# Patient Record
Sex: Male | Born: 1955 | Race: Black or African American | Hispanic: No | Smoking: Current every day smoker
Health system: Southern US, Community
[De-identification: ages and names within clinical notes are randomized; demographics above are authoritative.]

## PROBLEM LIST (undated history)

## (undated) HISTORY — PX: HAND SURGERY: SHX662

---

## 2004-05-29 ENCOUNTER — Emergency Department (HOSPITAL_COMMUNITY): Admission: EM | Admit: 2004-05-29 | Discharge: 2004-05-29 | Payer: Self-pay | Admitting: Emergency Medicine

## 2006-05-23 ENCOUNTER — Emergency Department (HOSPITAL_COMMUNITY): Admission: EM | Admit: 2006-05-23 | Discharge: 2006-05-23 | Payer: Self-pay | Admitting: Emergency Medicine

## 2006-08-21 ENCOUNTER — Emergency Department (HOSPITAL_COMMUNITY): Admission: EM | Admit: 2006-08-21 | Discharge: 2006-08-21 | Payer: Self-pay | Admitting: Emergency Medicine

## 2007-11-16 IMAGING — CR DG FOREARM 2V*R*
2 series · 2 of 2 positions shown · non-contrast
Comparison: none

CLINICAL DATA: 51 year-old, right forearm pain.
 RIGHT FOREARM ? 2 VIEW:

[x forearm ap right]
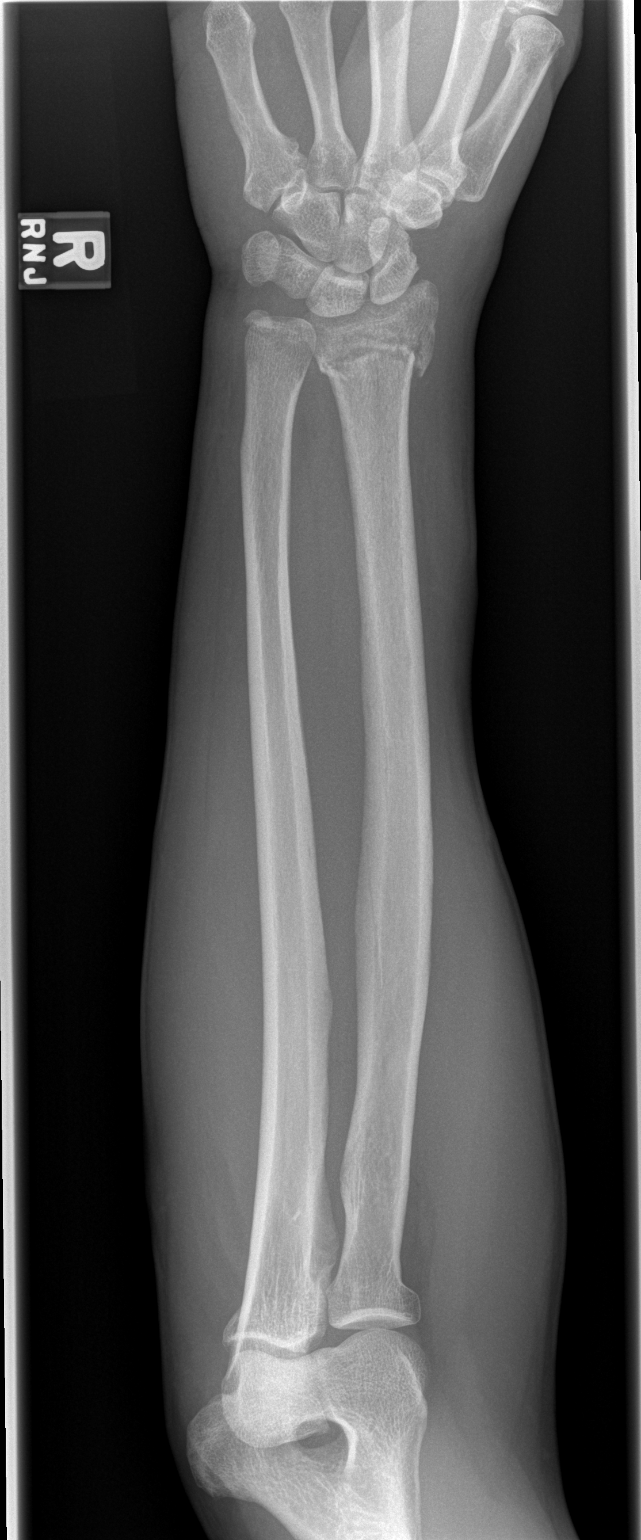

[x forearm lat right]
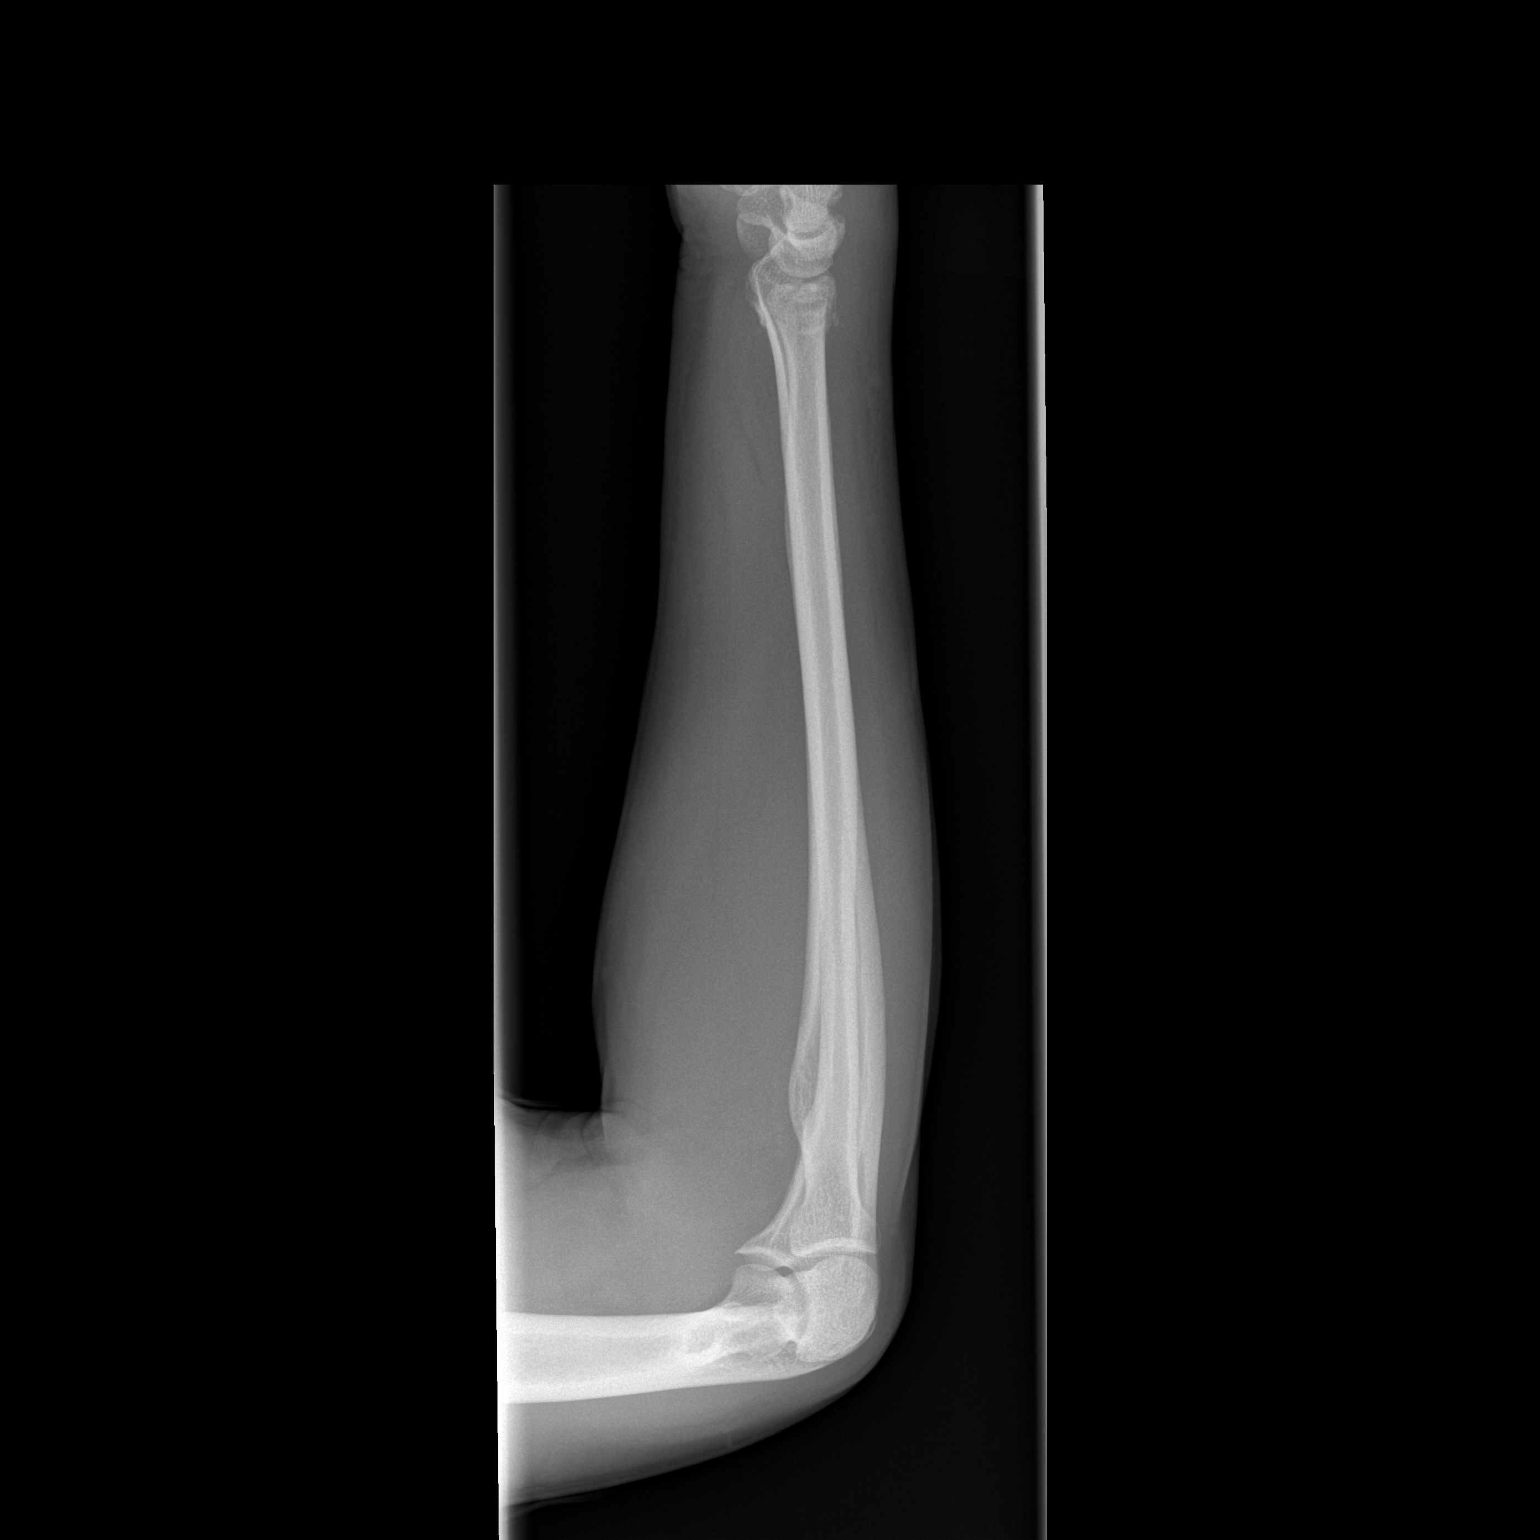

[2 of 2 positions shown; findings below may reference images not displayed]

FINDINGS: There is a nondisplaced transverse fracture through the distal radius, and an associated ulnar styloid fracture.  Wrists and elbow joints are maintained.
IMPRESSION: Distal radius and ulnar fractures.

## 2013-01-10 ENCOUNTER — Emergency Department (HOSPITAL_COMMUNITY)
Admission: EM | Admit: 2013-01-10 | Discharge: 2013-01-10 | Disposition: A | Discharge status: Home / Self Care | Payer: No Typology Code available for payment source | Source: Home / Self Care | Attending: Emergency Medicine | Admitting: Emergency Medicine

## 2013-01-10 ENCOUNTER — Encounter (HOSPITAL_COMMUNITY): Payer: Self-pay | Admitting: Emergency Medicine

## 2013-01-10 DIAGNOSIS — K047 Periapical abscess without sinus: Secondary | ICD-10-CM

## 2013-01-10 MED ORDER — PENICILLIN V POTASSIUM 500 MG PO TABS: 500.0000 mg | ORAL_TABLET | Freq: Four times a day (QID) | ORAL | Status: AC

## 2013-01-10 MED ORDER — HYDROCODONE-ACETAMINOPHEN 5-325 MG PO TABS: 1.0000 | ORAL_TABLET | Freq: Four times a day (QID) | ORAL | Status: DC | PRN

## 2013-01-10 NOTE — ED Notes (Signed)
C/o abscess on left side of mouth for three days now States he doesn't have any dentist appt yet.  Tylenol was taken as treatment

## 2013-01-10 NOTE — ED Provider Notes (Signed)
Medical screening examination/treatment/procedure(s) were performed by non-physician practitioner and as supervising physician I was immediately available for consultation/collaboration.  Leslee Home, M.D.  Reuben Likes, MD 01/10/13 [phone]

## 2013-01-10 NOTE — ED Provider Notes (Signed)
CSN: [id number]     Arrival date & time 01/10/13  1136 History   First MD Initiated Contact with Patient 01/10/13 1303     Chief Complaint  Patient presents with  . Dental Pain   (Consider location/radiation/quality/duration/timing/severity/associated sxs/prior Treatment) HPI Comments: Pt with several "bad teeth", pain for last 2 weeks, swelling and worse pain for last 3 days  Patient is a 57 y.o. male presenting with tooth pain. The history is provided by the patient.  Dental Pain Location:  Upper Upper teeth location:  10/LU lateral incisor Quality:  Aching and constant Severity:  Severe Onset quality:  Gradual Duration:  3 days Timing:  Constant Progression:  Worsening Chronicity:  Recurrent Context: dental caries and dental fracture   Relieved by:  Nothing Exacerbated by: smoking. Ineffective treatments:  Acetaminophen Associated symptoms: facial swelling and gum swelling   Associated symptoms: no congestion and no fever   Risk factors: periodontal disease and smoking     History reviewed. No pertinent past medical history. History reviewed. No pertinent past surgical history. History reviewed. No pertinent family history. History  Substance Use Topics  . Smoking status: Not on file  . Smokeless tobacco: Not on file  . Alcohol Use: Not on file    Review of Systems  Constitutional: Negative for fever and chills.  HENT: Positive for dental problem and facial swelling. Negative for congestion.     Allergies  Review of patient's allergies indicates no known allergies.  Home Medications   Current Outpatient Rx  Name  Route  Sig  Dispense  Refill  . HYDROcodone-acetaminophen (NORCO/VICODIN) 5-325 MG per tablet   Oral   Take 1-2 tablets by mouth every 6 (six) hours as needed.   10 tablet   0   . penicillin v potassium (VEETID) 500 MG tablet   Oral   Take 1 tablet (500 mg total) by mouth 4 (four) times daily.   40 tablet   0    BP 151/87  Pulse 69   Temp(Src) 98.1 F (36.7 C) (Oral)  Resp 18  SpO2 99% Physical Exam  Constitutional: He appears well-developed and well-nourished.  Appears uncomfortable  HENT:  Mouth/Throat: Dental abscesses and dental caries present.  A few teeth are absent. Remaining teeth are all in very poor repair.  All of them with chips/fractures, all discolored with evidence of significant decay.  Tooth causing pt most problems at this time is L upper lateral incisor. There is some swelling of the gum around this tooth. No swelling under tongue. No facial swelling  Lymphadenopathy:       Head (right side): No submental, no submandibular and no tonsillar adenopathy present.       Head (left side): No submental, no submandibular and no tonsillar adenopathy present.    ED Course  Procedures (including critical care time) Labs Review Labs Reviewed - No data to display Imaging Review No results found.  EKG Interpretation    Date/Time:    Ventricular Rate:    PR Interval:    QRS Duration:   QT Interval:    QTC Calculation:   R Axis:     Text Interpretation:              MDM   1. Dental abscess   rx penicillin 500mg  QID #40 and hydrocodone/apap 5/325 1-2 po q 6hours prn pain #10. Pt given dental resources.      Cathlyn Parsons, NP 01/10/13 1313

## 2013-01-13 ENCOUNTER — Ambulatory Visit: Payer: No Typology Code available for payment source | Attending: Internal Medicine

## 2013-01-13 VITALS — BP 133/84 | HR 75 | Temp 98.1°F | Resp 16 | Ht 66.0 in | Wt 136.0 lb

## 2013-01-13 DIAGNOSIS — K047 Periapical abscess without sinus: Secondary | ICD-10-CM

## 2013-01-13 DIAGNOSIS — Z23 Encounter for immunization: Secondary | ICD-10-CM

## 2013-01-13 MED ORDER — HYDROCODONE-ACETAMINOPHEN 5-325 MG PO TABS: 1.0000 | ORAL_TABLET | Freq: Four times a day (QID) | ORAL | Status: AC | PRN

## 2013-01-13 NOTE — Patient Instructions (Signed)
°  Dental Abscess °A dental abscess is a collection of infected fluid (pus) from a bacterial infection in the inner part of the tooth (pulp). It usually occurs at the end of the tooth's root.  °CAUSES  °· Severe tooth decay. °· Trauma to the tooth that allows bacteria to enter into the pulp, such as a broken or chipped tooth. °SYMPTOMS  °· Severe pain in and around the infected tooth. °· Swelling and redness around the abscessed tooth or in the mouth or face. °· Tenderness. °· Pus drainage. °· Bad breath. °· Bitter taste in the mouth. °· Difficulty swallowing. °· Difficulty opening the mouth. °· Nausea. °· Vomiting. °· Chills. °· Swollen neck glands. °DIAGNOSIS  °· A medical and dental history will be taken. °· An examination will be performed by tapping on the abscessed tooth. °· X-rays may be taken of the tooth to identify the abscess. °TREATMENT °The goal of treatment is to eliminate the infection. You may be prescribed antibiotic medicine to stop the infection from spreading. A root canal may be performed to save the tooth. If the tooth cannot be saved, it may be pulled (extracted) and the abscess may be drained.  °HOME CARE INSTRUCTIONS °· Only take over-the-counter or prescription medicines for pain, fever, or discomfort as directed by your caregiver. °· Rinse your mouth (gargle) often with salt water (¼ tsp salt in 8 oz [250 ml] of warm water) to relieve pain or swelling. °· Do not drive after taking pain medicine (narcotics). °· Do not apply heat to the outside of your face. °· Return to your dentist for further treatment as directed. °SEEK MEDICAL CARE IF: °· Your pain is not helped by medicine. °· Your pain is getting worse instead of better. °SEEK IMMEDIATE MEDICAL CARE IF: °· You have a fever or persistent symptoms for more than 2 3 days. °· You have a fever and your symptoms suddenly get worse. °· You have chills or a very bad headache. °· You have problems breathing or swallowing. °· You have trouble  opening your mouth. °· You have swelling in the neck or around the eye. °Document Released: 01/20/2005 Document Revised: 10/15/2011 Document Reviewed: 04/30/2010 °ExitCare® Patient Information ©2014 [employer]. ° ° °

## 2013-01-13 NOTE — Progress Notes (Unsigned)
Patient ID: MARVELLE CAUDILL, male   DOB: 04/29/1955, 57 y.o.   MRN: [id number] S - Mr. Roderick is seen today for dental pain. He was seen in urgent care 3 days ago. At that time he was started on amoxicillin and given 10 tablets of narcotic pain medicine. The pain has continued and although he is in [facility] he has been unable to get an appointment with a dentist. He complains of pain in the upper left teeth as well as lower right teeth. He denies any fever or weight loss or other constitutional symptoms. He has been able to eat and complains of the pain being worse at night.  O - v/s reviewed and stable. Nursing notes reviewed.  heent - very poor dentition, mucous membranes moist and pink tonsils are without exudates nares are patent Cardiovascular- regular rate and rhythm Lungs are clear  A/P Dental abscess - Plan: HYDROcodone-acetaminophen (NORCO/VICODIN) 5-325 MG per tablet, Ambulatory referral to Dentistry rtc 6-8 weeks for welness visit

## 2013-01-13 NOTE — Progress Notes (Unsigned)
Pt here to establish care for dental left side abscess x 2 mnths Pt was seen in urgent care 01/10/13 and prescribed Penicillin V Potassium and Hydrocodone  States swelling has resolved. Afebrile. Need urgent dental referral

## 2013-01-19 ENCOUNTER — Telehealth: Payer: Self-pay | Admitting: Internal Medicine

## 2013-01-19 ENCOUNTER — Telehealth: Payer: Self-pay | Admitting: Emergency Medicine

## 2013-01-19 ENCOUNTER — Other Ambulatory Visit: Payer: Self-pay | Admitting: Emergency Medicine

## 2013-01-19 MED ORDER — TRAMADOL HCL 50 MG PO TABS: 50.0000 mg | ORAL_TABLET | Freq: Three times a day (TID) | ORAL | Status: AC | PRN

## 2013-01-19 NOTE — Telephone Encounter (Signed)
Pt came in today to check on urgent dental referral s/p abscess infection Arna Medici sent out request for urgency Pt requesting pain meds Script tramadol #60 no refill given per Dr. Izola Price Pt given dentist number

## 2013-01-19 NOTE — Telephone Encounter (Signed)
Pt came in today to check on the status of his referral and to also see about getting a script refill for HYDROcodone-acetaminophen (NORCO/VICODIN) 5-325 MG per tablet; pt was informed that a nurse would come out to speak with him about his medication

## 2013-01-19 NOTE — Telephone Encounter (Signed)
Script for Tramadol 50 mg written until pt seen by dentist

## 2013-02-01 ENCOUNTER — Ambulatory Visit: Payer: Self-pay | Admitting: Internal Medicine

## 2013-11-14 ENCOUNTER — Ambulatory Visit: Payer: Self-pay | Attending: Internal Medicine

## 2014-03-10 ENCOUNTER — Ambulatory Visit: Payer: Self-pay | Attending: Internal Medicine | Admitting: Internal Medicine

## 2014-03-10 ENCOUNTER — Encounter: Payer: Self-pay | Admitting: Internal Medicine

## 2014-03-10 VITALS — BP 140/80 | HR 73 | Temp 98.0°F | Resp 16 | Wt 145.0 lb

## 2014-03-10 DIAGNOSIS — Z720 Tobacco use: Secondary | ICD-10-CM

## 2014-03-10 DIAGNOSIS — R030 Elevated blood-pressure reading, without diagnosis of hypertension: Secondary | ICD-10-CM

## 2014-03-10 DIAGNOSIS — F17210 Nicotine dependence, cigarettes, uncomplicated: Secondary | ICD-10-CM | POA: Insufficient documentation

## 2014-03-10 DIAGNOSIS — F17200 Nicotine dependence, unspecified, uncomplicated: Secondary | ICD-10-CM

## 2014-03-10 DIAGNOSIS — Z139 Encounter for screening, unspecified: Secondary | ICD-10-CM

## 2014-03-10 DIAGNOSIS — IMO0001 Reserved for inherently not codable concepts without codable children: Secondary | ICD-10-CM

## 2014-03-10 DIAGNOSIS — M25511 Pain in right shoulder: Secondary | ICD-10-CM | POA: Insufficient documentation

## 2014-03-10 DIAGNOSIS — Z23 Encounter for immunization: Secondary | ICD-10-CM | POA: Insufficient documentation

## 2014-03-10 MED ORDER — IBUPROFEN 800 MG PO TABS: 800.0000 mg | ORAL_TABLET | Freq: Three times a day (TID) | ORAL | Status: AC | PRN

## 2014-03-10 MED ORDER — BUPROPION HCL ER (XL) 150 MG PO TB24: 150.0000 mg | ORAL_TABLET | Freq: Every day | ORAL | Status: AC

## 2014-03-10 NOTE — Patient Instructions (Signed)
DASH Eating Plan DASH stands for "Dietary Approaches to Stop Hypertension." The DASH eating plan is a healthy eating plan that has been shown to reduce high blood pressure (hypertension). Additional health benefits may include reducing the risk of type 2 diabetes mellitus, heart disease, and stroke. The DASH eating plan may also help with weight loss. WHAT DO I NEED TO KNOW ABOUT THE DASH EATING PLAN? For the DASH eating plan, you will follow these general guidelines:  Choose foods with a percent daily value for sodium of less than 5% (as listed on the food label).  Use salt-free seasonings or herbs instead of table salt or sea salt.  Check with your health care provider or pharmacist before using salt substitutes.  Eat lower-sodium products, often labeled as "lower sodium" or "no salt added."  Eat fresh foods.  Eat more vegetables, fruits, and low-fat dairy products.  Choose whole grains. Look for the word "whole" as the first word in the ingredient list.  Choose fish and skinless chicken or turkey more often than red meat. Limit fish, poultry, and meat to 6 oz (170 g) each day.  Limit sweets, desserts, sugars, and sugary drinks.  Choose heart-healthy fats.  Limit cheese to 1 oz (28 g) per day.  Eat more home-cooked food and less restaurant, buffet, and fast food.  Limit fried foods.  Cook foods using methods other than frying.  Limit canned vegetables. If you do use them, rinse them well to decrease the sodium.  When eating at a restaurant, ask that your food be prepared with less salt, or no salt if possible. WHAT FOODS CAN I EAT? Seek help from a dietitian for individual calorie needs. Grains Whole grain or whole wheat bread. Brown rice. Whole grain or whole wheat pasta. Quinoa, bulgur, and whole grain cereals. Low-sodium cereals. Corn or whole wheat flour tortillas. Whole grain cornbread. Whole grain crackers. Low-sodium crackers. Vegetables Fresh or frozen vegetables  (raw, steamed, roasted, or grilled). Low-sodium or reduced-sodium tomato and vegetable juices. Low-sodium or reduced-sodium tomato sauce and paste. Low-sodium or reduced-sodium canned vegetables.  Fruits All fresh, canned (in natural juice), or frozen fruits. Meat and Other Protein Products Ground beef (85% or leaner), grass-fed beef, or beef trimmed of fat. Skinless chicken or turkey. Ground chicken or turkey. Pork trimmed of fat. All fish and seafood. Eggs. Dried beans, peas, or lentils. Unsalted nuts and seeds. Unsalted canned beans. Dairy Low-fat dairy products, such as skim or 1% milk, 2% or reduced-fat cheeses, low-fat ricotta or cottage cheese, or plain low-fat yogurt. Low-sodium or reduced-sodium cheeses. Fats and Oils Tub margarines without trans fats. Light or reduced-fat mayonnaise and salad dressings (reduced sodium). Avocado. Safflower, olive, or canola oils. Natural peanut or almond butter. Other Unsalted popcorn and pretzels. The items listed above may not be a complete list of recommended foods or beverages. Contact your dietitian for more options. WHAT FOODS ARE NOT RECOMMENDED? Grains White bread. White pasta. White rice. Refined cornbread. Bagels and croissants. Crackers that contain trans fat. Vegetables Creamed or fried vegetables. Vegetables in a cheese sauce. Regular canned vegetables. Regular canned tomato sauce and paste. Regular tomato and vegetable juices. Fruits Dried fruits. Canned fruit in light or heavy syrup. Fruit juice. Meat and Other Protein Products Fatty cuts of meat. Ribs, chicken wings, bacon, sausage, bologna, salami, chitterlings, fatback, hot dogs, bratwurst, and packaged luncheon meats. Salted nuts and seeds. Canned beans with salt. Dairy Whole or 2% milk, cream, half-and-half, and cream cheese. Whole-fat or sweetened yogurt. Full-fat   cheeses or blue cheese. Nondairy creamers and whipped toppings. Processed cheese, cheese spreads, or cheese  curds. Condiments Onion and garlic salt, seasoned salt, table salt, and sea salt. Canned and packaged gravies. Worcestershire sauce. Tartar sauce. Barbecue sauce. Teriyaki sauce. Soy sauce, including reduced sodium. Steak sauce. Fish sauce. Oyster sauce. Cocktail sauce. Horseradish. Ketchup and mustard. Meat flavorings and tenderizers. Bouillon cubes. Hot sauce. Tabasco sauce. Marinades. Taco seasonings. Relishes. Fats and Oils Butter, stick margarine, lard, shortening, ghee, and bacon fat. Coconut, palm kernel, or palm oils. Regular salad dressings. Other Pickles and olives. Salted popcorn and pretzels. The items listed above may not be a complete list of foods and beverages to avoid. Contact your dietitian for more information. WHERE CAN I FIND MORE INFORMATION? National Heart, Lung, and Blood Institute: www.nhlbi.nih.gov/health/health-topics/topics/dash/ Document Released: 01/09/2011 Document Revised: 06/06/2013 Document Reviewed: 11/24/2012 ExitCare Patient Information 2015 [employer]. This information is not intended to replace advice given to you by your health care provider. Make sure you discuss any questions you have with your health care provider. Smoking Cessation Quitting smoking is important to your health and has many advantages. However, it is not always easy to quit since nicotine is a very addictive drug. Oftentimes, people try 3 times or more before being able to quit. This document explains the best ways for you to prepare to quit smoking. Quitting takes hard work and a lot of effort, but you can do it. ADVANTAGES OF QUITTING SMOKING  You will live longer, feel better, and live better.  Your body will feel the impact of quitting smoking almost immediately.  Within 20 minutes, blood pressure decreases. Your pulse returns to its normal level.  After 8 hours, carbon monoxide levels in the blood return to normal. Your oxygen level increases.  After 24 hours, the chance of  having a heart attack starts to decrease. Your breath, hair, and body stop smelling like smoke.  After 48 hours, damaged nerve endings begin to recover. Your sense of taste and smell improve.  After 72 hours, the body is virtually free of nicotine. Your bronchial tubes relax and breathing becomes easier.  After 2 to 12 weeks, lungs can hold more air. Exercise becomes easier and circulation improves.  The risk of having a heart attack, stroke, cancer, or lung disease is greatly reduced.  After 1 year, the risk of coronary heart disease is cut in half.  After 5 years, the risk of stroke falls to the same as a nonsmoker.  After 10 years, the risk of lung cancer is cut in half and the risk of other cancers decreases significantly.  After 15 years, the risk of coronary heart disease drops, usually to the level of a nonsmoker.  If you are pregnant, quitting smoking will improve your chances of having a healthy baby.  The people you live with, especially any children, will be healthier.  You will have extra money to spend on things other than cigarettes. QUESTIONS TO THINK ABOUT BEFORE ATTEMPTING TO QUIT You may want to talk about your answers with your health care provider.  Why do you want to quit?  If you tried to quit in the past, what helped and what did not?  What will be the most difficult situations for you after you quit? How will you plan to handle them?  Who can help you through the tough times? Your family? Friends? A health care provider?  What pleasures do you get from smoking? What ways can you still get pleasure   if you quit? Here are some questions to ask your health care provider:  How can you help me to be successful at quitting?  What medicine do you think would be best for me and how should I take it?  What should I do if I need more help?  What is smoking withdrawal like? How can I get information on withdrawal? GET READY  Set a quit date.  Change your  environment by getting rid of all cigarettes, ashtrays, matches, and lighters in your home, car, or work. Do not let people smoke in your home.  Review your past attempts to quit. Think about what worked and what did not. GET SUPPORT AND ENCOURAGEMENT You have a better chance of being successful if you have help. You can get support in many ways.  Tell your family, friends, and coworkers that you are going to quit and need their support. Ask them not to smoke around you.  Get individual, group, or telephone counseling and support. Programs are available at local hospitals and health centers. Call your local health department for information about programs in your area.  Spiritual beliefs and practices may help some smokers quit.  Download a "quit meter" on your computer to keep track of quit statistics, such as how long you have gone without smoking, cigarettes not smoked, and money saved.  Get a self-help book about quitting smoking and staying off tobacco. LEARN NEW SKILLS AND BEHAVIORS  Distract yourself from urges to smoke. Talk to someone, go for a walk, or occupy your time with a task.  Change your normal routine. Take a different route to work. Drink tea instead of coffee. Eat breakfast in a different place.  Reduce your stress. Take a hot bath, exercise, or read a book.  Plan something enjoyable to do every day. Reward yourself for not smoking.  Explore interactive web-based programs that specialize in helping you quit. GET MEDICINE AND USE IT CORRECTLY Medicines can help you stop smoking and decrease the urge to smoke. Combining medicine with the above behavioral methods and support can greatly increase your chances of successfully quitting smoking.  Nicotine replacement therapy helps deliver nicotine to your body without the negative effects and risks of smoking. Nicotine replacement therapy includes nicotine gum, lozenges, inhalers, nasal sprays, and skin patches. Some may be  available over-the-counter and others require a prescription.  Antidepressant medicine helps people abstain from smoking, but how this works is unknown. This medicine is available by prescription.  Nicotinic receptor partial agonist medicine simulates the effect of nicotine in your brain. This medicine is available by prescription. Ask your health care provider for advice about which medicines to use and how to use them based on your health history. Your health care provider will tell you what side effects to look out for if you choose to be on a medicine or therapy. Carefully read the information on the package. Do not use any other product containing nicotine while using a nicotine replacement product.  RELAPSE OR DIFFICULT SITUATIONS Most relapses occur within the first 3 months after quitting. Do not be discouraged if you start smoking again. Remember, most people try several times before finally quitting. You may have symptoms of withdrawal because your body is used to nicotine. You may crave cigarettes, be irritable, feel very hungry, cough often, get headaches, or have difficulty concentrating. The withdrawal symptoms are only temporary. They are strongest when you first quit, but they will go away within 10-14 days. To reduce the   chances of relapse, try to:  Avoid drinking alcohol. Drinking lowers your chances of successfully quitting.  Reduce the amount of caffeine you consume. Once you quit smoking, the amount of caffeine in your body increases and can give you symptoms, such as a rapid heartbeat, sweating, and anxiety.  Avoid smokers because they can make you want to smoke.  Do not let weight gain distract you. Many smokers will gain weight when they quit, usually less than 10 pounds. Eat a healthy diet and stay active. You can always lose the weight gained after you quit.  Find ways to improve your mood other than smoking. FOR MORE INFORMATION  www.smokefree.gov  Document Released:  01/14/2001 Document Revised: 06/06/2013 Document Reviewed: 05/01/2011 ExitCare Patient Information 2015 [employer]. This information is not intended to replace advice given to you by your health care provider. Make sure you discuss any questions you have with your health care provider.  

## 2014-03-10 NOTE — Progress Notes (Signed)
MRN: [id number] Name: Vernon Mckinney  Sex: male Age: 59 y.o. DOB: [birth year]  Allergies: Review of patient's allergies indicates no known allergies.  Chief Complaint  Patient presents with  . Shoulder Pain    HPI: Patient is 59 y.o. male who comes today complaining of right shoulder pain on and off for the last 2 months denies any recent fall or trauma, denies any fever chills numbness weakness, patient smokes cigarettes, I have advised patient to quit smoking, he's going to try Wellbutrin, patient has not taken any over-the-counter medication for the pain.  History reviewed. No pertinent past medical history.  Past Surgical History  Procedure Laterality Date  . Hand surgery        Medication List       This list is accurate as of: 03/10/14  3:52 PM.  Always use your most recent med list.               buPROPion 150 MG 24 hr tablet  Commonly known as:  WELLBUTRIN XL  Take 1 tablet (150 mg total) by mouth daily.     HYDROcodone-acetaminophen 5-325 MG per tablet  Commonly known as:  NORCO/VICODIN  Take 1-2 tablets by mouth every 6 (six) hours as needed.     ibuprofen 800 MG tablet  Commonly known as:  ADVIL,MOTRIN  Take 1 tablet (800 mg total) by mouth every 8 (eight) hours as needed.     traMADol 50 MG tablet  Commonly known as:  ULTRAM  Take 1 tablet (50 mg total) by mouth every 8 (eight) hours as needed.        Meds ordered this encounter  Medications  . ibuprofen (ADVIL,MOTRIN) 800 MG tablet    Sig: Take 1 tablet (800 mg total) by mouth every 8 (eight) hours as needed.    Dispense:  30 tablet    Refill:  1  . buPROPion (WELLBUTRIN XL) 150 MG 24 hr tablet    Sig: Take 1 tablet (150 mg total) by mouth daily.    Dispense:  30 tablet    Refill:  3    Immunization History  Administered Date(s) Administered  . Influenza,inj,Quad PF,36+ Mos 01/13/2013    History reviewed. No pertinent family history.  History  Substance Use Topics  . Smoking  status: Current Every Day Smoker -- 1.00 packs/day for 15 years    Types: Cigarettes  . Smokeless tobacco: Not on file  . Alcohol Use: No    Review of Systems   As noted in HPI  Filed Vitals:   03/10/14 1526  BP: 159/79  Pulse: 73  Temp: 98 F (36.7 C)  Resp: 16    Physical Exam  Physical Exam  Constitutional: No distress.  Eyes: EOM are normal. Pupils are equal, round, and reactive to light.  Cardiovascular: Normal rate and regular rhythm.   Pulmonary/Chest: Breath sounds normal. No respiratory distress. He has no wheezes. He has no rales.  Musculoskeletal:  Right shoulder minimal tenderness laterally no swelling, good ROM, 2+ radialis pulse     CBC No results found for: WBC, RBC, HGB, HCT, PLT, MCV, LYMPHSABS, MONOABS, EOSABS, BASOSABS  CMP  No results found for: NA, K, CL, CO2, GLUCOSE, BUN, CREATININE, CALCIUM, PROT, ALBUMIN, AST, ALT, ALKPHOS, BILITOT, GFRNONAA, GFRAA  No results found for: CHOL  No components found for: HGA1C  No results found for: AST  Assessment and Plan  Right shoulder pain - Plan: ibuprofen (ADVIL,MOTRIN) 800 MG tablet, DG Shoulder Right  Needs flu shot Flu shot given today  Smoking - Plan:Trial of buPROPion (WELLBUTRIN XL) 150 MG 24 hr tablet  Need for prophylactic vaccination against Streptococcus pneumoniae (pneumococcus) Pneumovax given today.  Screening - Plan: CBC with Differential/Platelet, COMPLETE METABOLIC PANEL WITH GFR, TSH, Vit D  25 hydroxy (rtn osteoporosis monitoring), Lipid panel  Elevated BP Advised patient for DASH diet.  Health Maintenance  -Vaccinations: Flu shot and pneumovax today   Return in about 3 months (around 06/08/2014).  Doris CheadleADVANI, Meylin Stenzel, MD

## 2014-03-10 NOTE — Progress Notes (Signed)
Patient complains of pain to his right shoulder for the past two months Can not recall any injury Bothers him more at night while trying to sleep Does landscaping for a living

## 2014-03-12 ENCOUNTER — Ambulatory Visit (HOSPITAL_COMMUNITY)
Admission: RE | Admit: 2014-03-12 | Discharge: 2014-03-12 | Disposition: A | Discharge status: Home / Self Care | Payer: Self-pay | Source: Ambulatory Visit | Attending: Internal Medicine | Admitting: Internal Medicine

## 2014-03-12 ENCOUNTER — Ambulatory Visit (HOSPITAL_COMMUNITY)
Admission: RE | Admit: 2014-03-12 | Discharge: 2014-03-12 | Disposition: A | Discharge status: Home / Self Care | Payer: Self-pay | Source: Other Acute Inpatient Hospital | Attending: Internal Medicine | Admitting: Internal Medicine

## 2014-03-12 DIAGNOSIS — M25511 Pain in right shoulder: Secondary | ICD-10-CM | POA: Insufficient documentation

## 2014-03-13 ENCOUNTER — Telehealth: Payer: Self-pay

## 2014-03-13 ENCOUNTER — Telehealth: Payer: Self-pay | Admitting: Internal Medicine

## 2014-03-13 NOTE — Telephone Encounter (Signed)
Patient is aware of his x ray results 

## 2014-03-13 NOTE — Telephone Encounter (Signed)
Patient came in today to pick up medications and  just wanted to let the PCP know that he checked his BP on Saturday and BP was "normal"

## 2014-03-13 NOTE — Telephone Encounter (Signed)
-----   Message from Doris Cheadleeepak Advani, MD sent at 03/13/2014  1:41 PM EST ----- Call and let the patient know that his right shoulder x-ray reported   IMPRESSION: Degenerative change, without acute osseous finding.

## 2015-04-11 ENCOUNTER — Encounter (HOSPITAL_COMMUNITY): Payer: Self-pay

## 2015-04-11 ENCOUNTER — Emergency Department (HOSPITAL_COMMUNITY): Payer: Self-pay

## 2015-04-11 ENCOUNTER — Emergency Department (HOSPITAL_COMMUNITY)
Admission: EM | Admit: 2015-04-11 | Discharge: 2015-04-11 | Disposition: A | Discharge status: Home / Self Care | Payer: Self-pay | Attending: Emergency Medicine | Admitting: Emergency Medicine

## 2015-04-11 DIAGNOSIS — F17210 Nicotine dependence, cigarettes, uncomplicated: Secondary | ICD-10-CM | POA: Insufficient documentation

## 2015-04-11 DIAGNOSIS — J159 Unspecified bacterial pneumonia: Secondary | ICD-10-CM | POA: Insufficient documentation

## 2015-04-11 DIAGNOSIS — Z79899 Other long term (current) drug therapy: Secondary | ICD-10-CM | POA: Insufficient documentation

## 2015-04-11 DIAGNOSIS — R630 Anorexia: Secondary | ICD-10-CM | POA: Insufficient documentation

## 2015-04-11 DIAGNOSIS — J189 Pneumonia, unspecified organism: Secondary | ICD-10-CM

## 2015-04-11 MED ORDER — AMOXICILLIN-POT CLAVULANATE 875-125 MG PO TABS: 1.0000 | ORAL_TABLET | Freq: Two times a day (BID) | ORAL | Status: AC

## 2015-04-11 MED ORDER — BENZONATATE 100 MG PO CAPS: 200.0000 mg | ORAL_CAPSULE | Freq: Two times a day (BID) | ORAL | Status: AC | PRN

## 2015-04-11 MED ORDER — OXYMETAZOLINE HCL 0.05 % NA SOLN: 1.0000 | Freq: Two times a day (BID) | NASAL | Status: AC

## 2015-04-11 MED ORDER — AZITHROMYCIN 250 MG PO TABS: 250.0000 mg | ORAL_TABLET | Freq: Every day | ORAL | Status: AC

## 2015-04-11 NOTE — ED Notes (Signed)
PA at bedside.

## 2015-04-11 NOTE — ED Provider Notes (Signed)
CSN: [id number]     Arrival date & time 04/11/15  1409 History  By signing my name below, I, Placido SouLogan Joldersma, attest that this documentation has been prepared under the direction and in the presence of Barrett HenleNicole Elizabeth Nadeau, [place]. Electronically Signed: Placido SouLogan Joldersma, ED Scribe. 04/11/2015. 3:52 PM.   Chief Complaint  Patient presents with  . Chest Congestion   . Cough   The history is provided by the patient. No language interpreter was used.    HPI Comments: Vernon Mckinney is a 60 y.o. male who presents to the Emergency Department complaining of constant, moderate, productive cough with yellow sputum onset 1 week ago. Pt reports associated chills, subjective fever (98.4 F in triage), rhinorrhea, right sided HA, post nasal drip, wheezing and decreased appetite. He notes taking Theraflu and Advil for his symptoms which provide short term relief. Pt denies any known medical conditions or taking regular medications. Pt reports a hx of smoking an average of 1 PPD and has decreased to 1/2 PPD since the onset of his symptoms. He denies ear pain, sore throat, SOB, CP, n/v or any other associated symptoms at this time. Patient denies any recent sick contacts. Denies any recent use of antibiotics or hospitalizations.   History reviewed. No pertinent past medical history. Past Surgical History  Procedure Laterality Date  . Hand surgery     History reviewed. No pertinent family history. Social History  Substance Use Topics  . Smoking status: Current Every Day Smoker -- 1.00 packs/day for 15 years    Types: Cigarettes  . Smokeless tobacco: None  . Alcohol Use: Yes     Comment: occ    Review of Systems  Constitutional: Positive for fever, chills and appetite change.  HENT: Positive for congestion, postnasal drip and rhinorrhea. Negative for ear pain and sore throat.   Respiratory: Positive for wheezing. Negative for shortness of breath.   Cardiovascular: Negative for chest pain.   Gastrointestinal: Negative for nausea and vomiting.  Neurological: Positive for headaches.    Allergies  Review of patient's allergies indicates no known allergies.  Home Medications   Prior to Admission medications   Medication Sig Start Date End Date Taking? Authorizing Provider  amoxicillin-clavulanate (AUGMENTIN) 875-125 MG tablet Take 1 tablet by mouth every 12 (twelve) hours. 04/11/15   Barrett HenleNicole Elizabeth Nadeau, PA-C  azithromycin (ZITHROMAX) 250 MG tablet Take 1 tablet (250 mg total) by mouth daily. Take first 2 tablets together, then 1 every day until finished. 04/11/15   Barrett HenleNicole Elizabeth Nadeau, PA-C  benzonatate (TESSALON) 100 MG capsule Take 2 capsules (200 mg total) by mouth 2 (two) times daily as needed for cough. 04/11/15   Barrett HenleNicole Elizabeth Nadeau, PA-C  buPROPion (WELLBUTRIN XL) 150 MG 24 hr tablet Take 1 tablet (150 mg total) by mouth daily. 03/10/14   Doris Cheadleeepak Advani, MD  HYDROcodone-acetaminophen (NORCO/VICODIN) 5-325 MG per tablet Take 1-2 tablets by mouth every 6 (six) hours as needed. 01/13/13   Acey LavAllison L Wood, MD  ibuprofen (ADVIL,MOTRIN) 800 MG tablet Take 1 tablet (800 mg total) by mouth every 8 (eight) hours as needed. 03/10/14   Doris Cheadleeepak Advani, MD  oxymetazoline (AFRIN NASAL SPRAY) 0.05 % nasal spray Place 1 spray into both nostrils 2 (two) times daily. 04/11/15   Barrett HenleNicole Elizabeth Nadeau, PA-C  traMADol (ULTRAM) 50 MG tablet Take 1 tablet (50 mg total) by mouth every 8 (eight) hours as needed. 01/19/13   Dorothea OgleIskra M Myers, MD   BP 124/74 mmHg  Pulse 93  Temp(Src)  98.4 F (36.9 C) (Oral)  Resp 16  Ht  (1.676 m)  Wt 63.504 kg  BMI 22.61 kg/m2  SpO2 97%    Physical Exam  Constitutional: He is oriented to person, place, and time. He appears well-developed and well-nourished.  HENT:  Head: Normocephalic and atraumatic.  Right Ear: Tympanic membrane normal.  Left Ear: Tympanic membrane normal.  Nose: Rhinorrhea present. Right sinus exhibits no maxillary sinus tenderness  and no frontal sinus tenderness. Left sinus exhibits no maxillary sinus tenderness and no frontal sinus tenderness.  Mouth/Throat: Uvula is midline, oropharynx is clear and moist and mucous membranes are normal. No oropharyngeal exudate, posterior oropharyngeal edema, posterior oropharyngeal erythema or tonsillar abscesses.  Eyes: EOM are normal.  Neck: Normal range of motion.  Cardiovascular: Normal rate, regular rhythm, normal heart sounds and intact distal pulses.  Exam reveals no gallop and no friction rub.   No murmur heard. Pulmonary/Chest: Effort normal and breath sounds normal. No respiratory distress. He has no wheezes. He has no rales. He exhibits no tenderness.  Abdominal: Soft. Bowel sounds are normal. He exhibits no distension and no mass. There is no tenderness. There is no rebound and no guarding.  Musculoskeletal: Normal range of motion.  Lymphadenopathy:    He has no cervical adenopathy.  Neurological: He is alert and oriented to person, place, and time. He has normal strength. No cranial nerve deficit or sensory deficit. Coordination normal.  Skin: Skin is warm and dry.  Psychiatric: He has a normal mood and affect.  Nursing note and vitals reviewed.   ED Course  Procedures  DIAGNOSTIC STUDIES: Oxygen Saturation is 97% on RA, normal by my interpretation.    COORDINATION OF CARE: 3:52 PM Discussed next steps with pt. He verbalized understanding and is agreeable with the plan.   Labs Review Labs Reviewed - No data to display  Imaging Review Dg Chest 2 View  04/11/2015  CLINICAL DATA:  One week of cough and fever, current smoker common no other complaints. EXAM: CHEST  2 VIEW COMPARISON:  Chest x-ray of May 29, 2004 FINDINGS: There is mild hyperinflation with mild hemidiaphragm flattening. There is increased density in the anterior aspect of the left lower lobe suspicious for pneumonia. The right lung is clear. The heart and pulmonary vascularity are normal. The  mediastinum is normal in width. The bony thorax exhibits no acute abnormality. IMPRESSION: COPD.  Left lower lobe pneumonia. Followup PA and lateral chest X-ray is recommended in 3-4 weeks following trial of antibiotic therapy to ensure resolution and exclude underlying malignancy. Electronically Signed   By: David  [place] M.D.   On: 04/11/2015 15:19   I have personally reviewed and evaluated these images as part of my medical decision-making.   EKG Interpretation None      MDM   Final diagnoses:  Community acquired pneumonia    Patient has been diagnosed with CAP via chest xray. Pt is not ill appearing, immunocompromised, and does not have multiple co morbidities, therefore I feel like the they can be treated as an OP with abx therapy. However, CXR revealed findings consistent with COPD. Pt denies ever being dx with COPD but also reports he currently does not have a PCP, endorses 15 pack year hx. Due to co morbidity of COPD, will d/c pt home with rx for augmenting and azithromycin and discussed symptomatic tx. Pt given resource to follow up with PCP. Pt has been advised to return to the ED if symptoms worsen or they do  not improve. Pt verbalizes understanding and is agreeable with plan.    I personally performed the services described in this documentation, which was scribed in my presence. The recorded information has been reviewed and is accurate.    Satira Sark Barnum, [place] 04/11/15 1610  Mancel Bale, MD 04/11/15 [phone]

## 2015-04-11 NOTE — ED Notes (Addendum)
Pt c/o chest congestion and cough x 1 week.  Denies pain.  Pt reports taking OTC medication w/o relief.  Dry cough noted during triage.

## 2015-04-11 NOTE — Discharge Instructions (Signed)
Take all of your medications as prescribed. Take both of your antibiotics as prescribed until you have completed the prescription. You may take Tylenol as prescribed over-the-counter as needed for fever and/or body aches. Drink 2-3 L of water daily to remain hydrated at home. I recommend refraining from smoking while you are being treated for pneumonia as smoking may worsen your symptoms and prolong your illness. Please follow up with a primary care provider from the Resource Guide provided below in 5 days for follow up. I recommend scheduling to have a follow-up chest x-ray performed in 3-4 weeks after you have completed your antibiotics. Please return to the Emergency Department if symptoms worsen or new onset of uncontrollable fever, difficulty breathing, wheezing, chest pain, lightheadedness, dizziness, unable tolerate fluids.

## 2015-04-11 NOTE — ED Notes (Signed)
Patient was alert, oriented and stable upon discharge. RN went over AVS and patient had no further questions.  

## 2015-06-07 IMAGING — DX DG SHOULDER 2+V*R*
3 series · 3 of 3 positions shown · non-contrast
Comparison: None.

CLINICAL DATA: Initial encounter for pain without trauma.

EXAM:
RIGHT SHOULDER - 2+ VIEW

[shoulder grashey]
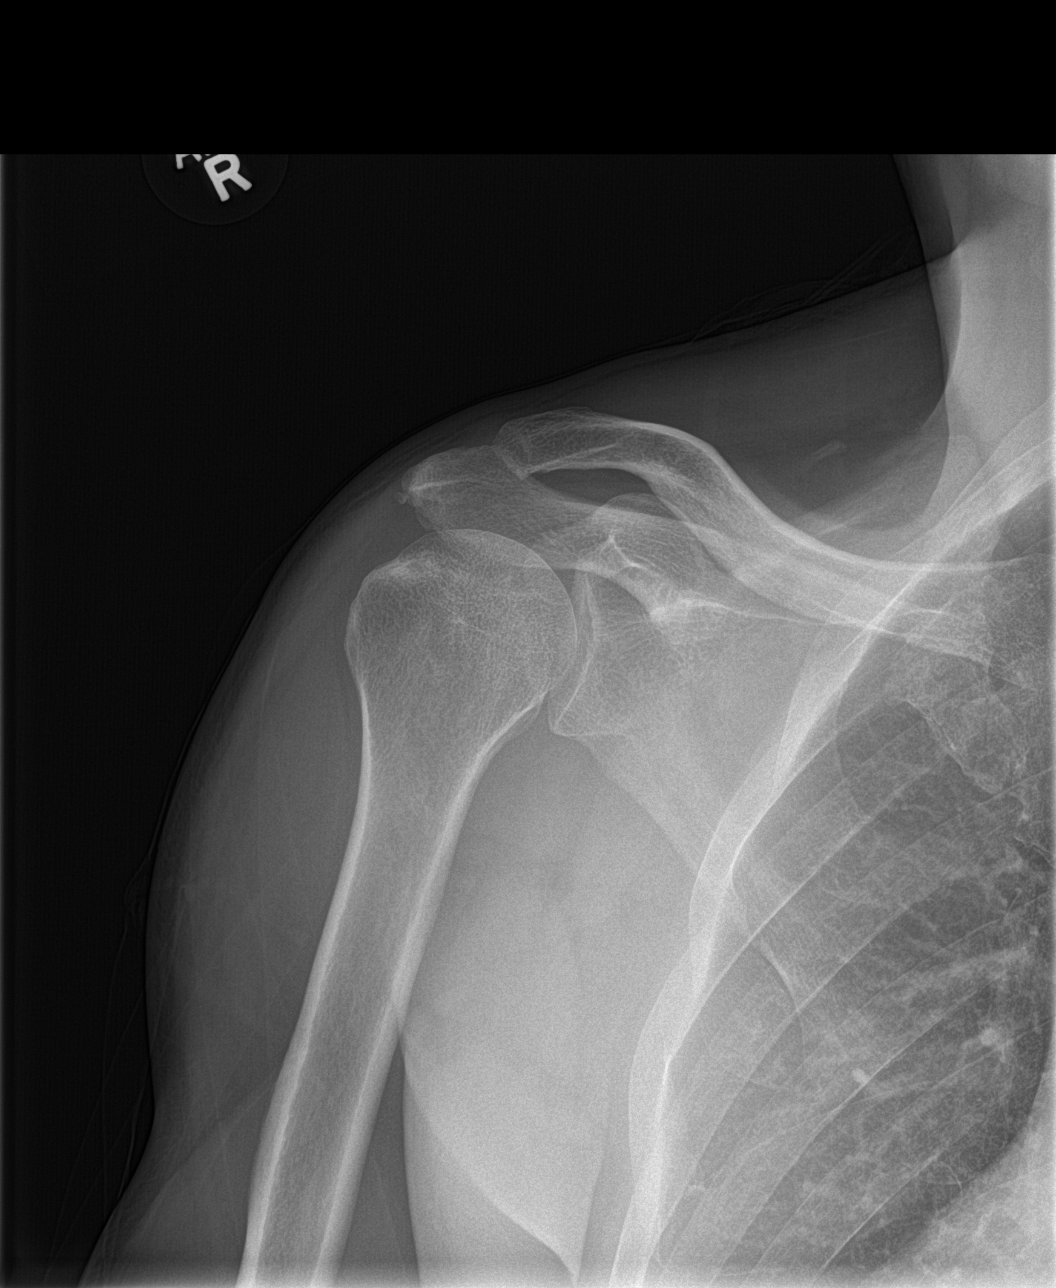

[shoulder y view]
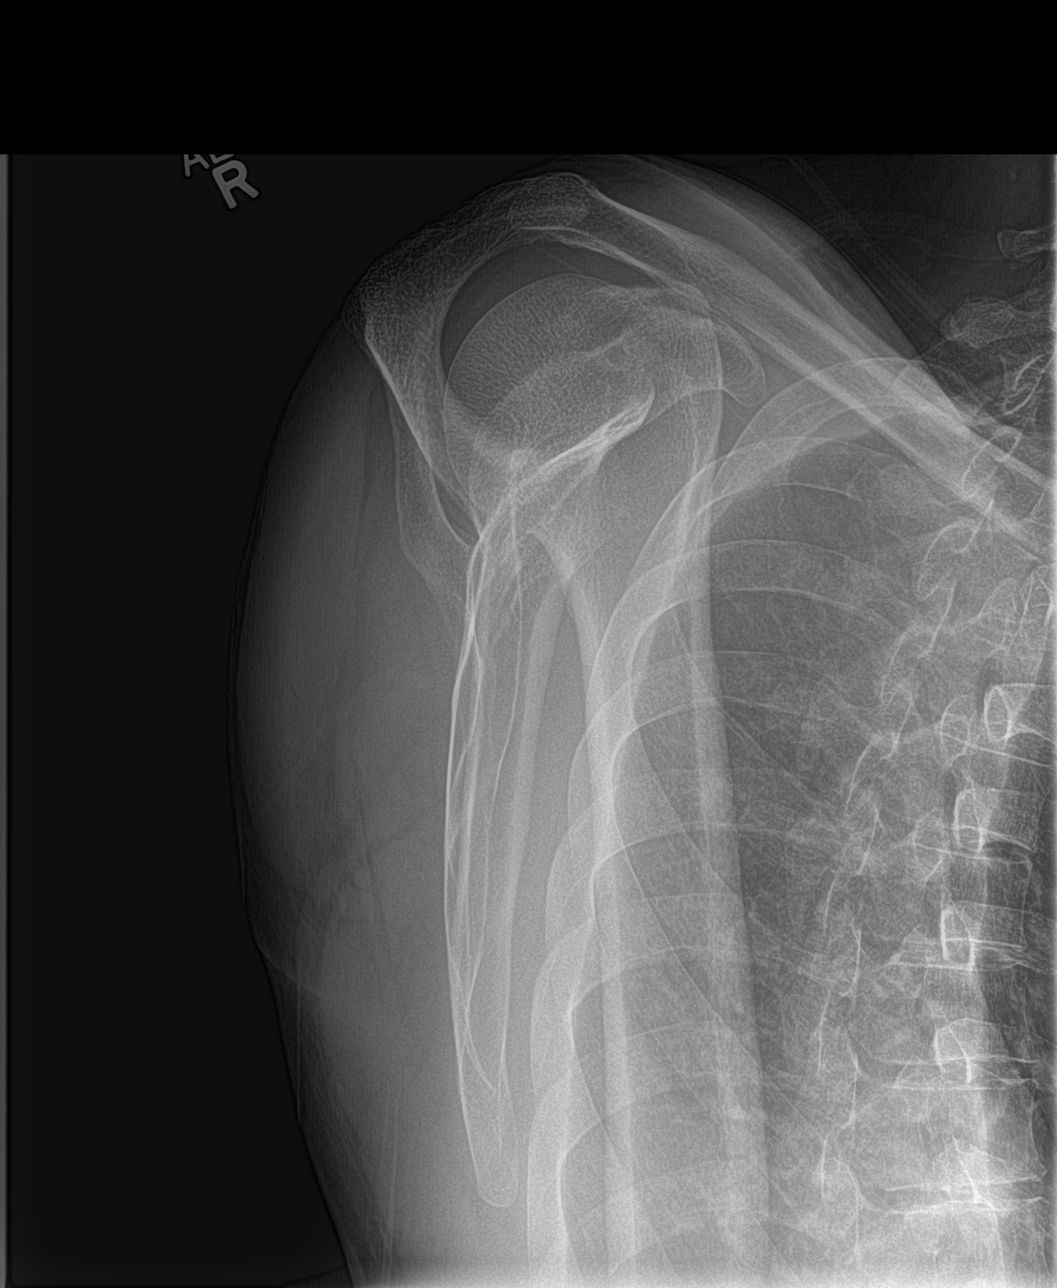

[shoulder axillary]
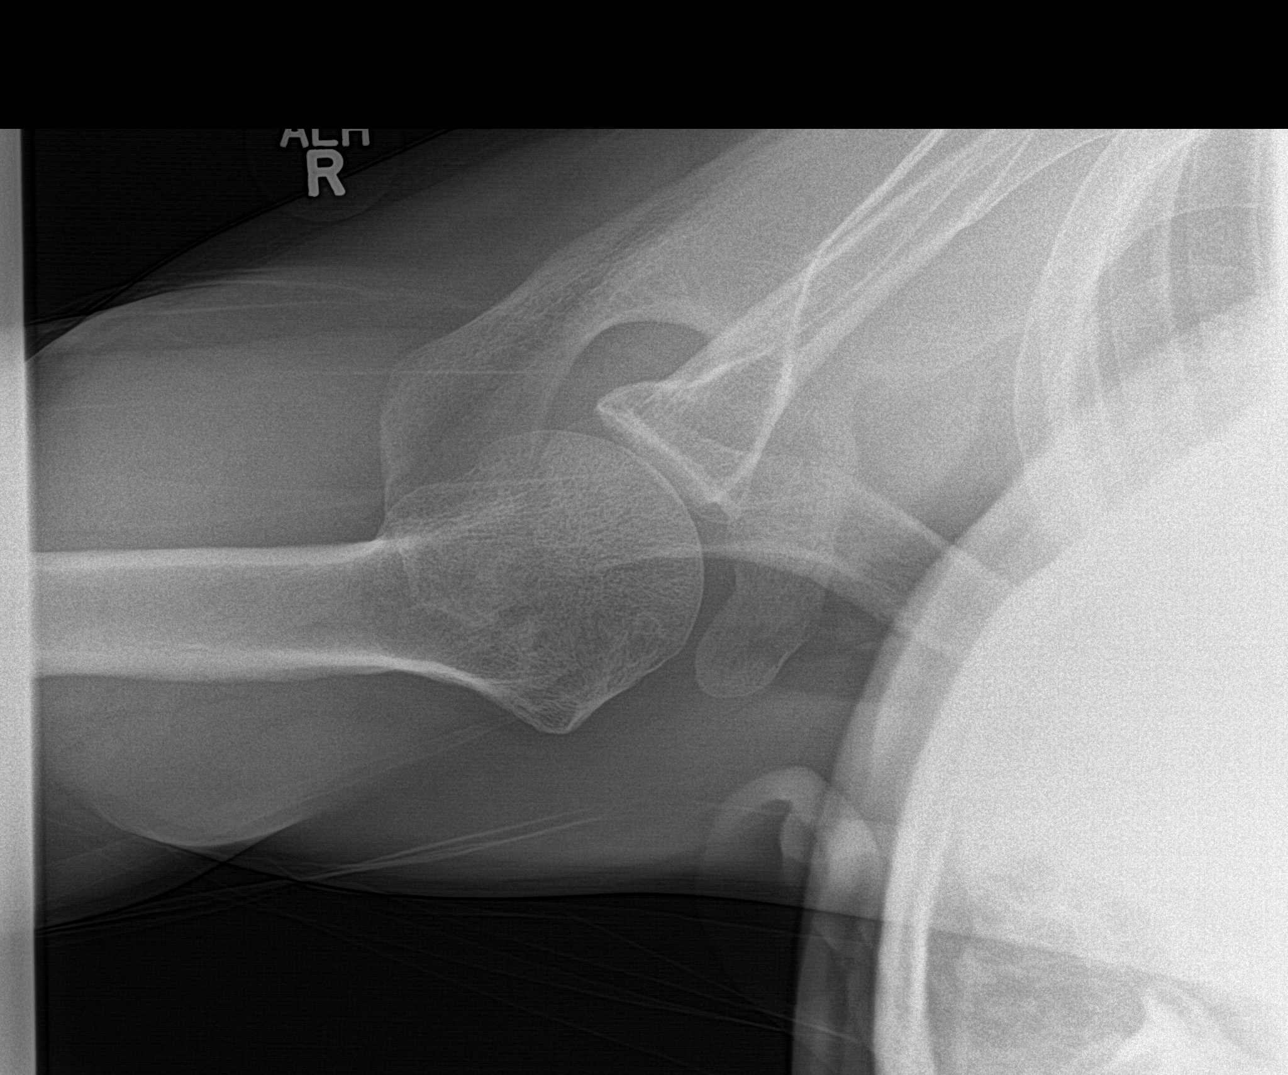

[3 of 3 positions shown; findings below may reference images not displayed]

FINDINGS: Degenerative versus remote posttraumatic irregularity about the
acromion. Tiny ossific fragment without donor site. There is also
degenerative irregularity of the undersurface of the
acromioclavicular joint. No acute fracture or dislocation. The
humeral head projects minimally anterior to the central glenoid on
the scapular view, likely positional. Visualized portion of the
right hemithorax is normal.
IMPRESSION: Degenerative change, without acute osseous finding.
